# Patient Record
Sex: Female | Born: 1935 | Race: White | Hispanic: No | Marital: Married
Health system: Southern US, Community
[De-identification: ages and names within clinical notes are randomized; demographics above are authoritative.]

## PROBLEM LIST (undated history)

## (undated) DIAGNOSIS — C569 Malignant neoplasm of unspecified ovary: Secondary | ICD-10-CM

## (undated) HISTORY — PX: BREAST EXCISIONAL BIOPSY: SUR124

---

## 1998-05-24 ENCOUNTER — Other Ambulatory Visit: Admission: RE | Admit: 1998-05-24 | Discharge: 1998-05-24 | Payer: Self-pay | Admitting: *Deleted

## 1999-05-28 ENCOUNTER — Other Ambulatory Visit: Admission: RE | Admit: 1999-05-28 | Discharge: 1999-05-28 | Payer: Self-pay | Admitting: *Deleted

## 2000-04-21 ENCOUNTER — Encounter: Admission: RE | Admit: 2000-04-21 | Discharge: 2000-04-21 | Payer: Self-pay | Admitting: *Deleted

## 2000-04-21 ENCOUNTER — Encounter: Payer: Self-pay | Admitting: *Deleted

## 2001-05-03 ENCOUNTER — Encounter: Admission: RE | Admit: 2001-05-03 | Discharge: 2001-05-03 | Payer: Self-pay | Admitting: *Deleted

## 2002-05-12 ENCOUNTER — Encounter: Payer: Self-pay | Admitting: *Deleted

## 2002-05-12 ENCOUNTER — Encounter: Admission: RE | Admit: 2002-05-12 | Discharge: 2002-05-12 | Payer: Self-pay | Admitting: *Deleted

## 2002-05-18 ENCOUNTER — Encounter: Admission: RE | Admit: 2002-05-18 | Discharge: 2002-05-18 | Payer: Self-pay | Admitting: *Deleted

## 2002-05-18 ENCOUNTER — Encounter: Payer: Self-pay | Admitting: *Deleted

## 2003-05-23 ENCOUNTER — Encounter: Payer: Self-pay | Admitting: Family Medicine

## 2003-05-23 ENCOUNTER — Encounter: Admission: RE | Admit: 2003-05-23 | Discharge: 2003-05-23 | Payer: Self-pay | Admitting: Family Medicine

## 2004-06-06 ENCOUNTER — Encounter: Admission: RE | Admit: 2004-06-06 | Discharge: 2004-06-06 | Payer: Self-pay | Admitting: Family Medicine

## 2004-09-10 ENCOUNTER — Ambulatory Visit (HOSPITAL_COMMUNITY): Admission: RE | Admit: 2004-09-10 | Discharge: 2004-09-10 | Payer: Self-pay | Admitting: Gastroenterology

## 2004-09-10 ENCOUNTER — Encounter (INDEPENDENT_AMBULATORY_CARE_PROVIDER_SITE_OTHER): Payer: Self-pay | Admitting: *Deleted

## 2005-06-12 ENCOUNTER — Encounter: Admission: RE | Admit: 2005-06-12 | Discharge: 2005-06-12 | Payer: Self-pay | Admitting: Family Medicine

## 2006-06-15 ENCOUNTER — Encounter: Admission: RE | Admit: 2006-06-15 | Discharge: 2006-06-15 | Payer: Self-pay | Admitting: Family Medicine

## 2007-05-04 ENCOUNTER — Encounter: Admission: RE | Admit: 2007-05-04 | Discharge: 2007-05-04 | Payer: Self-pay | Admitting: Gastroenterology

## 2007-06-21 ENCOUNTER — Encounter: Admission: RE | Admit: 2007-06-21 | Discharge: 2007-06-21 | Payer: Self-pay | Admitting: Family Medicine

## 2007-07-05 ENCOUNTER — Ambulatory Visit (HOSPITAL_COMMUNITY): Admission: RE | Admit: 2007-07-05 | Discharge: 2007-07-05 | Payer: Self-pay | Admitting: Gastroenterology

## 2007-11-26 ENCOUNTER — Ambulatory Visit (HOSPITAL_COMMUNITY): Admission: RE | Admit: 2007-11-26 | Discharge: 2007-11-26 | Payer: Self-pay | Admitting: Gastroenterology

## 2008-06-21 ENCOUNTER — Encounter: Admission: RE | Admit: 2008-06-21 | Discharge: 2008-06-21 | Payer: Self-pay | Admitting: Family Medicine

## 2009-06-22 ENCOUNTER — Encounter: Admission: RE | Admit: 2009-06-22 | Discharge: 2009-06-22 | Payer: Self-pay | Admitting: Family Medicine

## 2010-06-25 ENCOUNTER — Encounter: Admission: RE | Admit: 2010-06-25 | Discharge: 2010-06-25 | Payer: Self-pay | Admitting: Family Medicine

## 2011-05-06 NOTE — Op Note (Signed)
[name], Leslie Keith              ACCOUNT NO.:  [id number]   MEDICAL RECORD NO.:  [id number]          PATIENT TYPE:  AMB   LOCATION:  ENDO                         FACILITY:  [facility]   PHYSICIAN:  Graylin Shiver, M.D.   DATE OF BIRTH:  Feb 10, 1931   DATE OF PROCEDURE:  11/26/2007  DATE OF DISCHARGE:  11/26/2007                               OPERATIVE REPORT   PROCEDURE:  Esophagogastroduodenoscopy with endoscopic balloon  dilatation of a Schatzki's ring.   INDICATIONS FOR PROCEDURE:  Dysphagia, history of a Schatzki's ring  previously dilated to 18 mm.  The patient has had some recurrent  symptoms of dysphagia.  Informed consent was obtained after explanation  of the risks of bleeding, infection, and perforation.   PREMEDICATION:  1. Fentanyl 70 mcg IV.  2. Versed 7 mg IV.   DESCRIPTION OF PROCEDURE:  With the patient in the left lateral  decubitus position, the Pentax gastroscope was inserted into the  oropharynx and passed into the esophagus.  It was advanced down the  esophagus, then into the stomach, and into the duodenum.  The second  portion and bulb of the duodenum were normal.  The stomach showed normal  mucosa.  There was a hiatal hernia.  A Schatzki's ring was identified in  the distal esophagus.  An endoscopic balloon dilator was advanced down  the scope and appropriately placed to the level of the ring.  The  balloon was inflated to 18 mm and held in place for one minute and then  19 mm and held in place for one minute.  There was some heme around the  dilator site.  The rest of the esophagus looked normal.  She tolerated  the procedure well without complications.   IMPRESSION:  1. Schatzki's ring dilated to 19 mm.  2. Hiatal hernia.           ______________________________  Graylin Shiver, M.D.     SFG/MEDQ  D:  11/28/2007  T:  11/28/2007  Job:  [id number]

## 2011-05-06 NOTE — Op Note (Signed)
Leslie Keith, Leslie Keith              ACCOUNT NO.:  [id number]   MEDICAL RECORD NO.:  [id number]          PATIENT TYPE:  AMB   LOCATION:  ENDO                         FACILITY:  [facility]   PHYSICIAN:  Graylin Shiver, M.D.   DATE OF BIRTH:  Nov 19, 1931   DATE OF PROCEDURE:  07/05/2007  DATE OF DISCHARGE:                               OPERATIVE REPORT   PROCEDURE:  Esophagogastroduodenoscopy with endoscopic balloon  dilatation of a Schatzki's ring.   INDICATIONS FOR PROCEDURE:  Dysphasia.  A barium swallow was done.  There was a small hiatal hernia and review of this did reveal a  Schatzki's ring.   Informed consent was obtained after explanation of the risks of  bleeding, infection and perforation.   PREMEDICATION:  Fentanyl 100 mcg IV, Versed 8 mg IV.   PROCEDURE:  With the patient in the left lateral decubitus position, the  Pentax gastroscope was inserted into the oropharynx and passed into the  esophagus.  It was advanced down the esophagus then into the stomach and  into the duodenum.  The second portion and bulb of the duodenum looked  normal.  The stomach had normal-appearing mucosa.  There was a moderate-  sized hiatal hernia.  At the level of the distal esophagus there was a  Schatzki's ring which appeared to be causing some mild narrowing of the  esophageal lumen.  An endoscopic balloon dilator was advanced down the  scope and appropriately placed at the level of the Schatzki's ring.  It  was inflated to 15, then 16.5, then 18 mm and held in place at each  level for one minute.  It was then deflated and removed.  There was some  heme at the site of the dilation.  The rest of the esophagus looked  normal.  She tolerated the procedure well without complications.   IMPRESSION:  1. Schatzki's ring.  2. Hiatal hernia.   PLAN:  We will observe the response to the dilatation.           ______________________________  Graylin Shiver, M.D.     SFG/MEDQ  D:  07/05/2007  T:   07/05/2007  Job:  [id number]   cc:   Joycelyn Rua, M.D.  Fax: [phone]

## 2011-05-09 NOTE — Op Note (Signed)
NAMEMAYBELLE, DEPAOLI NO.:  [id number]   MEDICAL RECORD NO.:  [id number]          PATIENT TYPE:  AMB   LOCATION:  ENDO                         FACILITY:  [facility]   PHYSICIAN:  Graylin Shiver, M.D.   DATE OF BIRTH:  March 09, 1931   DATE OF PROCEDURE:  09/10/2004  DATE OF DISCHARGE:                                 OPERATIVE REPORT   INDICATIONS FOR PROCEDURE:  Screening.  Informed consent was obtained after  explanation of the risks of bleeding, infection, and perforation.   PREMEDICATION:  Fentanyl 75 mcg IV, Versed 7.5 mg IV.   DESCRIPTION OF PROCEDURE:  With the patient in the left lateral decubitus  position, a rectal examination was performed.  No masses were felt.  The  Olympus colonoscope was inserted into the rectum and advanced around the  colon to the cecum.  Cecal landmarks were identified.  The cecum looked  normal.  In the ascending colon, there was a small 3 mm polyp biopsied off  with cold forceps.  The transverse colon looked normal, the descending  colon, sigmoid, and rectum looked normal.  She tolerated the procedure well  without complications.   IMPRESSION:  Small ascending colon polyp.   PLAN:  The biopsies will be checked.   Of note is that the patient was given preprocedure antibiotics because of a  history of rheumatic fever in the past.       SFG/MEDQ  D:  09/10/2004  T:  09/11/2004  Job:  [id number]   cc:   Joycelyn Rua, M.D.  [address] [place]  [place] [address]  Fax: [phone]

## 2011-06-05 ENCOUNTER — Other Ambulatory Visit: Payer: Self-pay | Admitting: Family Medicine

## 2011-06-05 DIAGNOSIS — Z1231 Encounter for screening mammogram for malignant neoplasm of breast: Secondary | ICD-10-CM

## 2011-06-27 ENCOUNTER — Ambulatory Visit
Admission: RE | Admit: 2011-06-27 | Discharge: 2011-06-27 | Disposition: A | Discharge status: Home / Self Care | Payer: 59 | Source: Ambulatory Visit | Attending: Family Medicine | Admitting: Family Medicine

## 2011-06-27 DIAGNOSIS — Z1231 Encounter for screening mammogram for malignant neoplasm of breast: Secondary | ICD-10-CM

## 2011-09-03 ENCOUNTER — Ambulatory Visit (HOSPITAL_COMMUNITY)
Admission: RE | Admit: 2011-09-03 | Discharge: 2011-09-03 | Disposition: A | Discharge status: Home / Self Care | Payer: Medicare Other | Source: Ambulatory Visit | Attending: Gastroenterology | Admitting: Gastroenterology

## 2011-09-03 DIAGNOSIS — K222 Esophageal obstruction: Secondary | ICD-10-CM | POA: Insufficient documentation

## 2011-09-03 DIAGNOSIS — Z79899 Other long term (current) drug therapy: Secondary | ICD-10-CM | POA: Insufficient documentation

## 2011-09-03 DIAGNOSIS — E785 Hyperlipidemia, unspecified: Secondary | ICD-10-CM | POA: Insufficient documentation

## 2011-09-03 DIAGNOSIS — R1311 Dysphagia, oral phase: Secondary | ICD-10-CM | POA: Insufficient documentation

## 2011-09-03 DIAGNOSIS — K648 Other hemorrhoids: Secondary | ICD-10-CM | POA: Insufficient documentation

## 2011-09-03 DIAGNOSIS — I10 Essential (primary) hypertension: Secondary | ICD-10-CM | POA: Insufficient documentation

## 2011-09-03 DIAGNOSIS — R195 Other fecal abnormalities: Secondary | ICD-10-CM | POA: Insufficient documentation

## 2011-09-03 DIAGNOSIS — K449 Diaphragmatic hernia without obstruction or gangrene: Secondary | ICD-10-CM | POA: Insufficient documentation

## 2011-09-05 NOTE — Op Note (Signed)
  NAMEDAISEY, CALOCA NO.:  [id number]  MEDICAL RECORD NO.:  [id number]  LOCATION:  WLEN                         FACILITY:  [facility]  PHYSICIAN:  Graylin Shiver, M.D.   DATE OF BIRTH:  [birth year]  DATE OF PROCEDURE:  09/03/2011 DATE OF DISCHARGE:                              OPERATIVE REPORT   PROCEDURE:  Colonoscopy.  INDICATIONS FOR PROCEDURE:  Heme-positive stool, rule out colon lesion.  Informed consent was obtained after explanation of the risks of bleeding, infection, and perforation.  PREMEDICATION:  The procedure was done after an EGD.  See EGD report for total dosage given.  DESCRIPTION OF THE PROCEDURE:  With the patient in the left lateral decubitus position, a rectal exam was performed and no masses were felt. The Pentax colonoscope was inserted into the rectum and advanced around the colon to the cecum.  Cecal landmarks were identified.  The cecum and ascending colon were normal.  The transverse colon normal.  The descending colon, sigmoid, and rectum were normal.  The scope was retroflexed and there were some small internal hemorrhoids.  She tolerated the procedure well without complications.  IMPRESSION:  Normal colonoscopy to the cecum with small internal hemorrhoids.          ______________________________ Graylin Shiver, M.D.     SFG/MEDQ  D:  09/03/2011  T:  09/03/2011  Job:  [id number]  Electronically Signed by Herbert Moors MD on 09/05/2011 05:00:58 PM

## 2011-09-05 NOTE — Op Note (Signed)
  NAMEZAIAH, CREDEUR NO.:  [id number]  MEDICAL RECORD NO.:  [id number]  LOCATION:  WLEN                         FACILITY:  [facility]  PHYSICIAN:  Graylin Shiver, M.D.   DATE OF BIRTH:  1931/08/23  DATE OF PROCEDURE: DATE OF DISCHARGE:                              OPERATIVE REPORT   PROCEDURE:  Esophagogastroduodenoscopy with balloon dilatation of a Schatzki ring.  INDICATION FOR PROCEDURE:  Dysphagia secondary to a Schatzki ring.  Informed consent was obtained after explanation of the risks of bleeding, infection, and perforation.  PREMEDICATIONS: 1. Fentanyl 75 mcg IV. 2. Versed 6 mg IV.  DESCRIPTION OF THE PROCEDURE:  With the patient in the left lateral decubitus position, the Pentax gastroscope was inserted into the oropharynx and passed into the esophagus.  It was advanced down the esophagus to the level of the Schatzki ring.  The scope was able to be advanced into the stomach and then into the duodenum.  The second portion and bulb of the duodenum were normal.  The stomach had a hiatal hernia present, but the mucosa otherwise looked unremarkable.  The scope was brought back up into the esophagus, then a balloon dilator was advanced down the scope and placed at the level of the Schatzki ring. It was inflated to 18 mm.  There was heme at the site of the dilation. She tolerated the procedure well.  No other abnormalities were seen.  IMPRESSION:  Schatzki ring dilated to 18 mm.          ______________________________ Graylin Shiver, M.D.     SFG/MEDQ  D:  09/03/2011  T:  09/03/2011  Job:  [id number]  Electronically Signed by Herbert Moors MD on 09/05/2011 05:00:56 PM

## 2012-06-02 ENCOUNTER — Other Ambulatory Visit: Payer: Self-pay | Admitting: Family Medicine

## 2012-06-02 DIAGNOSIS — Z1231 Encounter for screening mammogram for malignant neoplasm of breast: Secondary | ICD-10-CM

## 2012-06-28 ENCOUNTER — Ambulatory Visit
Admission: RE | Admit: 2012-06-28 | Discharge: 2012-06-28 | Disposition: A | Discharge status: Home / Self Care | Payer: Medicare Other | Source: Ambulatory Visit | Attending: Family Medicine | Admitting: Family Medicine

## 2012-06-28 DIAGNOSIS — Z1231 Encounter for screening mammogram for malignant neoplasm of breast: Secondary | ICD-10-CM

## 2013-05-23 ENCOUNTER — Other Ambulatory Visit: Payer: Self-pay

## 2013-05-23 DIAGNOSIS — Z1231 Encounter for screening mammogram for malignant neoplasm of breast: Secondary | ICD-10-CM

## 2013-06-30 ENCOUNTER — Ambulatory Visit
Admission: RE | Admit: 2013-06-30 | Discharge: 2013-06-30 | Disposition: A | Discharge status: Home / Self Care | Payer: Medicare Other | Source: Ambulatory Visit

## 2013-06-30 DIAGNOSIS — Z1231 Encounter for screening mammogram for malignant neoplasm of breast: Secondary | ICD-10-CM

## 2013-08-03 ENCOUNTER — Other Ambulatory Visit: Payer: Self-pay

## 2014-05-23 ENCOUNTER — Other Ambulatory Visit: Payer: Self-pay

## 2014-05-23 DIAGNOSIS — Z1231 Encounter for screening mammogram for malignant neoplasm of breast: Secondary | ICD-10-CM

## 2014-06-27 ENCOUNTER — Other Ambulatory Visit (HOSPITAL_BASED_OUTPATIENT_CLINIC_OR_DEPARTMENT_OTHER): Payer: Self-pay | Admitting: Family Medicine

## 2014-06-27 ENCOUNTER — Encounter (HOSPITAL_BASED_OUTPATIENT_CLINIC_OR_DEPARTMENT_OTHER): Payer: Self-pay

## 2014-06-27 ENCOUNTER — Ambulatory Visit (HOSPITAL_BASED_OUTPATIENT_CLINIC_OR_DEPARTMENT_OTHER)
Admission: RE | Admit: 2014-06-27 | Discharge: 2014-06-27 | Disposition: A | Discharge status: Home / Self Care | Payer: Medicare Other | Source: Ambulatory Visit | Attending: Family Medicine | Admitting: Family Medicine

## 2014-06-27 DIAGNOSIS — R1900 Intra-abdominal and pelvic swelling, mass and lump, unspecified site: Secondary | ICD-10-CM

## 2014-06-27 MED ORDER — IOHEXOL 300 MG/ML  SOLN
100.0000 mL | Freq: Once | INTRAMUSCULAR | Status: AC | PRN
  Administered 2014-06-27: 100 mL via INTRAVENOUS

## 2014-07-03 ENCOUNTER — Ambulatory Visit
Admission: RE | Admit: 2014-07-03 | Discharge: 2014-07-03 | Disposition: A | Discharge status: Home / Self Care | Payer: 59 | Source: Ambulatory Visit

## 2014-07-03 DIAGNOSIS — Z1231 Encounter for screening mammogram for malignant neoplasm of breast: Secondary | ICD-10-CM

## 2015-05-29 ENCOUNTER — Other Ambulatory Visit: Payer: Self-pay

## 2015-05-29 DIAGNOSIS — Z1231 Encounter for screening mammogram for malignant neoplasm of breast: Secondary | ICD-10-CM

## 2015-07-06 ENCOUNTER — Ambulatory Visit: Payer: Self-pay

## 2015-07-24 ENCOUNTER — Ambulatory Visit
Admission: RE | Admit: 2015-07-24 | Discharge: 2015-07-24 | Disposition: A | Discharge status: Home / Self Care | Payer: Medicare Other | Source: Ambulatory Visit

## 2015-07-24 DIAGNOSIS — Z1231 Encounter for screening mammogram for malignant neoplasm of breast: Secondary | ICD-10-CM

## 2016-07-03 ENCOUNTER — Other Ambulatory Visit: Payer: Self-pay | Admitting: Family Medicine

## 2016-07-03 DIAGNOSIS — Z1231 Encounter for screening mammogram for malignant neoplasm of breast: Secondary | ICD-10-CM

## 2016-07-24 ENCOUNTER — Ambulatory Visit
Admission: RE | Admit: 2016-07-24 | Discharge: 2016-07-24 | Disposition: A | Discharge status: Home / Self Care | Payer: Medicare Other | Source: Ambulatory Visit | Attending: Family Medicine | Admitting: Family Medicine

## 2016-07-24 DIAGNOSIS — Z1231 Encounter for screening mammogram for malignant neoplasm of breast: Secondary | ICD-10-CM

## 2017-06-17 ENCOUNTER — Other Ambulatory Visit: Payer: Self-pay | Admitting: Family Medicine

## 2017-06-17 DIAGNOSIS — Z1231 Encounter for screening mammogram for malignant neoplasm of breast: Secondary | ICD-10-CM

## 2017-07-27 ENCOUNTER — Ambulatory Visit
Admission: RE | Admit: 2017-07-27 | Discharge: 2017-07-27 | Disposition: A | Discharge status: Home / Self Care | Payer: Medicare Other | Source: Ambulatory Visit | Attending: Family Medicine | Admitting: Family Medicine

## 2017-07-27 DIAGNOSIS — Z1231 Encounter for screening mammogram for malignant neoplasm of breast: Secondary | ICD-10-CM

## 2017-07-27 HISTORY — DX: Malignant neoplasm of unspecified ovary: C56.9

## 2018-06-18 ENCOUNTER — Emergency Department (HOSPITAL_COMMUNITY)
Admission: EM | Admit: 2018-06-18 | Discharge: 2018-06-18 | Disposition: A | Discharge status: Home / Self Care | Payer: Medicare Other | Attending: Emergency Medicine | Admitting: Emergency Medicine

## 2018-06-18 ENCOUNTER — Encounter (HOSPITAL_COMMUNITY): Payer: Self-pay

## 2018-06-18 ENCOUNTER — Emergency Department (HOSPITAL_COMMUNITY): Payer: Medicare Other

## 2018-06-18 ENCOUNTER — Other Ambulatory Visit: Payer: Self-pay

## 2018-06-18 DIAGNOSIS — R509 Fever, unspecified: Secondary | ICD-10-CM

## 2018-06-18 DIAGNOSIS — R5381 Other malaise: Secondary | ICD-10-CM | POA: Insufficient documentation

## 2018-06-18 DIAGNOSIS — Z79899 Other long term (current) drug therapy: Secondary | ICD-10-CM | POA: Insufficient documentation

## 2018-06-18 DIAGNOSIS — Z8543 Personal history of malignant neoplasm of ovary: Secondary | ICD-10-CM | POA: Insufficient documentation

## 2018-06-18 DIAGNOSIS — Z7982 Long term (current) use of aspirin: Secondary | ICD-10-CM | POA: Diagnosis not present

## 2018-06-18 LAB — COMPREHENSIVE METABOLIC PANEL
ALBUMIN: 3 g/dL — AB (ref 3.5–5.0)
ALK PHOS: 55 U/L (ref 38–126)
ALT: 37 U/L (ref 0–44)
AST: 33 U/L (ref 15–41)
Anion gap: 9 (ref 5–15)
BILIRUBIN TOTAL: 0.4 mg/dL (ref 0.3–1.2)
BUN: 19 mg/dL (ref 8–23)
CO2: 25 mmol/L (ref 22–32)
CREATININE: 0.71 mg/dL (ref 0.44–1.00)
Calcium: 8.4 mg/dL — ABNORMAL LOW (ref 8.9–10.3)
Chloride: 99 mmol/L (ref 98–111)
GFR calc Af Amer: >60 mL/min (ref >60)
Glucose, Bld: 161 mg/dL — ABNORMAL HIGH (ref 70–99)
Potassium: 3.3 mmol/L — ABNORMAL LOW (ref 3.5–5.1)
Sodium: 133 mmol/L — ABNORMAL LOW (ref 135–145)
TOTAL PROTEIN: 6 g/dL — AB (ref 6.5–8.1)

## 2018-06-18 LAB — URINALYSIS, ROUTINE W REFLEX MICROSCOPIC
Bilirubin Urine: NEGATIVE
GLUCOSE, UA: NEGATIVE mg/dL
HGB URINE DIPSTICK: NEGATIVE
KETONES UR: 20 mg/dL — AB
LEUKOCYTES UA: NEGATIVE
Nitrite: NEGATIVE
PROTEIN: NEGATIVE mg/dL
Specific Gravity, Urine: 1.018 (ref 1.005–1.030)
pH: 7 (ref 5.0–8.0)

## 2018-06-18 LAB — CBC WITH DIFFERENTIAL/PLATELET
BASOS ABS: 0 10*3/uL (ref 0.0–0.1)
Basophils Relative: 0 %
EOS PCT: 0 %
Eosinophils Absolute: 0 10*3/uL (ref 0.0–0.7)
HCT: 35.4 % — ABNORMAL LOW (ref 36.0–46.0)
Hemoglobin: 11 g/dL — ABNORMAL LOW (ref 12.0–15.0)
LYMPHS PCT: 12 %
Lymphs Abs: 0.7 10*3/uL (ref 0.7–4.0)
MCH: 26.2 pg (ref 26.0–34.0)
MCHC: 31.1 g/dL (ref 30.0–36.0)
MCV: 84.3 fL (ref 78.0–100.0)
MONO ABS: 0.3 10*3/uL (ref 0.1–1.0)
Monocytes Relative: 6 %
Neutro Abs: 4.8 10*3/uL (ref 1.7–7.7)
Neutrophils Relative %: 82 %
PLATELETS: 174 10*3/uL (ref 150–400)
RBC: 4.2 MIL/uL (ref 3.87–5.11)
RDW: 13.5 % (ref 11.5–15.5)
WBC: 5.9 10*3/uL (ref 4.0–10.5)

## 2018-06-18 LAB — I-STAT CG4 LACTIC ACID, ED
Lactic Acid, Venous: 1.98 mmol/L — ABNORMAL HIGH (ref 0.5–1.9)
Lactic Acid, Venous: 1.31 mmol/L (ref 0.5–1.9)

## 2018-06-18 MED ORDER — IOPAMIDOL (ISOVUE-300) INJECTION 61%
100.0000 mL | Freq: Once | INTRAVENOUS | Status: AC | PRN
  Administered 2018-06-18: 100 mL via INTRAVENOUS

## 2018-06-18 MED ORDER — SODIUM CHLORIDE 0.9 % IV BOLUS
500.0000 mL | Freq: Once | INTRAVENOUS | Status: AC
  Administered 2018-06-18: 500 mL via INTRAVENOUS

## 2018-06-18 MED ORDER — ACETAMINOPHEN 325 MG PO TABS: 650.0000 mg | ORAL_TABLET | Freq: Once | ORAL | Status: DC

## 2018-06-18 MED ORDER — IOPAMIDOL (ISOVUE-300) INJECTION 61%
INTRAVENOUS | Status: AC
Start: 2018-06-18 — End: 2018-06-18
  Administered 2018-06-18: 19:00:00
  Filled 2018-06-18: qty 100

## 2018-06-18 MED ORDER — DOXYCYCLINE HYCLATE 100 MG PO CAPS: 100.0000 mg | ORAL_CAPSULE | Freq: Two times a day (BID) | ORAL | 0 refills | Status: AC

## 2018-06-18 MED ORDER — SODIUM CHLORIDE 0.9 % IV SOLN
INTRAVENOUS | Status: DC
  Administered 2018-06-18: 15:00:00 via INTRAVENOUS

## 2018-06-18 MED ORDER — IOPAMIDOL (ISOVUE-300) INJECTION 61%
30.0000 mL | Freq: Once | INTRAVENOUS | Status: AC
  Administered 2018-06-18: 30 mL via ORAL

## 2018-06-18 NOTE — ED Triage Notes (Addendum)
Pt arrives from PCP via EMS--Pt c/o nausea, malaise, weakness x2 days. Was seen at PCP and encouraged pt to be seen in ED as she was too weak to stand up. Fever 102 F with EMS, 1000mg  tylenol given PO en route.Hx of ovarian CA, Pt states she feels very similar to when she was first dx, she reports being in remission but that she has a very aggressive type of CA. A/Ox4.

## 2018-06-18 NOTE — ED Notes (Signed)
Bed: WA23 Expected date:  Expected time:  Means of arrival:  Comments: EMS 

## 2018-06-18 NOTE — ED Provider Notes (Signed)
[facility] DEPT Provider Note   CSN: [id number] Arrival date & time: 06/18/18  1203     History   Chief Complaint Chief Complaint  Patient presents with  . Emesis  . Fever  . Weakness    HPI Leslie Keith is a 84 y.o. female.  HPI Pt was outside in the yard yesterday afternoon when she suddenly felt chilled and then started to feel hot.  She has had general malaise.  No vomiting or diarrhea. No cough or dysuria.  No tick bites or rashes. She went to her doctors office today and they noticed she had a fever.  She was sent to the ED for further evaluation.  Pt is worried about cancer because she had a fever when she was diagnosed with ovarian cancer.  Past Medical History:  Diagnosis Date  . Ovarian cancer ([place])     There are no active problems to display for this patient.   Past Surgical History:  Procedure Laterality Date  . BREAST EXCISIONAL BIOPSY Right    benign     OB History   None      Home Medications    Prior to Admission medications   Medication Sig Start Date End Date Taking? Authorizing Provider  aspirin 81 MG tablet Take 81 mg by mouth daily.   Yes [provider]  Calcium Carb-Cholecalciferol 600-400 MG-UNIT TABS Take 1 tablet by mouth 2 (two) times daily.   Yes [provider]  diclofenac (VOLTAREN) 50 MG EC tablet Take 50 mg by mouth 3 (three) times daily as needed for pain. 04/12/18  Yes [provider]  hydrochlorothiazide (HYDRODIURIL) 12.5 MG tablet Take 12.5 mg by mouth daily. 06/11/18  Yes [provider]  lidocaine-prilocaine (EMLA) cream Apply 0.5 inches topically See admin instructions. 30 minutes prior to chemotherapy. Cover with syran wrap 02/19/17  Yes [provider]  Multiple Vitamins-Minerals (MULTIVITAMIN ADULT PO) Take 1 tablet by mouth daily.   Yes [provider]  Omega-3 Krill Oil 300 MG CAPS Take 300 mg by mouth daily.   Yes [provider]  ramipril (ALTACE) 10 MG capsule Take 10 mg by mouth daily. 05/12/18  Yes [provider]  TOPROL XL 100 MG 24 hr tablet Take 100 mg by mouth daily. 05/12/18  Yes [provider]    Family History History reviewed. No pertinent family history.  Social History Social History   Tobacco Use  . Smoking status: Unknown If Ever Smoked  Substance Use Topics  . Alcohol use: Not on file  . Drug use: Not on file     Allergies   Ciprofloxacin and Fluocinolone   Review of Systems Review of Systems  All other systems reviewed and are negative.    Physical Exam Updated Vital Signs BP 131/66   Pulse 82   Temp 98.5 F (36.9 C) (Oral)   Resp 20   Ht 1.524 m (5')   Wt 49.9 kg (110 lb)   SpO2 97%   BMI 21.48 kg/m   Physical Exam  HENT:  Head: Normocephalic and atraumatic.  Right Ear: External ear normal.  Left Ear: External ear normal.  Eyes: Conjunctivae are normal. Right eye exhibits no discharge. Left eye exhibits no discharge. No scleral icterus.  Neck: Neck supple. No tracheal deviation present.  Cardiovascular: Normal rate, regular rhythm and intact distal pulses.  Pulmonary/Chest: Effort normal and breath sounds normal. No stridor. No respiratory distress. She has no wheezes. She has no  rales.  Abdominal: Soft. Bowel sounds are normal. She exhibits no distension. There is no tenderness. There is no rebound and no guarding.  Musculoskeletal: She exhibits no edema or tenderness.  Neurological: She is alert. She has normal strength. No cranial nerve deficit (no facial droop, extraocular movements intact, no slurred speech) or sensory deficit. She exhibits normal muscle tone. She displays no seizure activity. Coordination normal.  Skin: Skin is warm and dry. No rash noted. She is not diaphoretic.  Psychiatric: She has a normal mood and affect.  Nursing note and vitals reviewed.    ED Treatments / Results  Labs (all labs ordered are listed, but only  abnormal results are displayed) Labs Reviewed  COMPREHENSIVE METABOLIC PANEL - Abnormal; Notable for the following components:      Result Value   Sodium 133 (*)    Potassium 3.3 (*)    Glucose, Bld 161 (*)    Calcium 8.4 (*)    Total Protein 6.0 (*)    Albumin 3.0 (*)    All other components within normal limits  CBC WITH DIFFERENTIAL/PLATELET - Abnormal; Notable for the following components:   Hemoglobin 11.0 (*)    HCT 35.4 (*)    All other components within normal limits  URINALYSIS, ROUTINE W REFLEX MICROSCOPIC - Abnormal; Notable for the following components:   APPearance HAZY (*)    Ketones, ur 20 (*)    All other components within normal limits  I-STAT CG4 LACTIC ACID, ED - Abnormal; Notable for the following components:   Lactic Acid, Venous 1.98 (*)    All other components within normal limits  CULTURE, BLOOD (ROUTINE X 2)  CULTURE, BLOOD (ROUTINE X 2)  URINE CULTURE  ROCKY MTN SPOTTED FVR ABS PNL(IGG+IGM)  I-STAT CG4 LACTIC ACID, ED    EKG None  Radiology Dg Chest 2 View  Result Date: 06/18/2018 CLINICAL DATA:  84 year old female with weakness and subjective fever EXAM: CHEST - 2 VIEW COMPARISON:  None. FINDINGS: Right IJ approach single-lumen power injectable port catheter. Catheter tip overlies the distal SVC. The cardiac and mediastinal contours are within normal limits. No focal airspace consolidation, pleural effusion, pulmonary edema or pneumothorax. Minimal bronchitic changes are very likely chronic. Degenerative facet arthropathy present in the visualized cervical spine. No acute osseous abnormality. IMPRESSION: 1. No acute cardiopulmonary process. 2. Right IJ approach single-lumen power injectable port catheter with the catheter tip overlying the distal SVC. Electronically Signed   By: Jacqulynn Cadet M.D.   On: 06/18/2018 13:20    Procedures Procedures (including critical care time)  Medications Ordered in ED Medications  acetaminophen (TYLENOL)  tablet 650 mg (has no administration in time range)  0.9 %  sodium chloride infusion ( Intravenous New Bag/Given 06/18/18 1438)  sodium chloride 0.9 % bolus 500 mL (has no administration in time range)  iopamidol (ISOVUE-300) 61 % injection 30 mL (30 mLs Oral Contrast Given 06/18/18 1539)     Initial Impression / Assessment and Plan / ED Course  I have reviewed the triage vital signs and the nursing notes.  Pertinent labs & imaging results that were available during my care of the patient were reviewed by me and considered in my medical decision making (see chart for details).   Patient's laboratory tests and x-ray was reviewed.  No clear etiology for her fever.  Patient does have a small rash on her upper back.  She has been out gardening.  She denies any tick bites but I will add on titers  [facility] spotted fever.  Patient is concerned about the recurrence of ovarian cancer since she initially presented with fevers.  We discussed treating empirically initially with outpatient follow-up.  Patient is very anxious and since we do not have an etiology for fever we will get a CT scan to evaluate for any acute pathology.  If negative I think the patient otherwise appears stable for outpatient management.  Empiric treatment with doxycycline considering her rash is reasonable.  Dr Tamera Punt will follow up on the labs  Final Clinical Impressions(s) / ED Diagnoses  pending   Dorie Rank, MD 06/18/18 [phone]

## 2018-06-18 NOTE — ED Provider Notes (Signed)
Patient care was taken over from Dr. Tomi Bamberger.  Patient presented with a fever.  She does not have an identifiable source.  Her labs are non-concerning.  She has no suggestions of sepsis.  CT scan shows no acute abnormality.  There is cholelithiasis without evidence of cholecystitis.  She does have a red circular area in the posterior aspect of her left shoulder.  It looks like it could be an insect bite versus tick bite.  It is not a target lesion.  There is no other rash.  Titers were spent for [facility] spotted fever.  I will start her on doxycycline.  I advised her to have close follow-up with her PCP and return here if she has any worsening symptoms over the weekend.  Results for orders placed or performed during the hospital encounter of 06/18/18  Comprehensive metabolic panel  Result Value Ref Range   Sodium 133 (L) 135 - 145 mmol/L   Potassium 3.3 (L) 3.5 - 5.1 mmol/L   Chloride 99 98 - 111 mmol/L   CO2 25 22 - 32 mmol/L   Glucose, Bld 161 (H) 70 - 99 mg/dL   BUN 19 8 - 23 mg/dL   Creatinine, Ser 0.71 0.44 - 1.00 mg/dL   Calcium 8.4 (L) 8.9 - 10.3 mg/dL   Total Protein 6.0 (L) 6.5 - 8.1 g/dL   Albumin 3.0 (L) 3.5 - 5.0 g/dL   AST 33 15 - 41 U/L   ALT 37 0 - 44 U/L   Alkaline Phosphatase 55 38 - 126 U/L   Total Bilirubin 0.4 0.3 - 1.2 mg/dL   GFR calc non Af Amer >60 >60 mL/min   GFR calc Af Amer >60 >60 mL/min   Anion gap 9 5 - 15  CBC WITH DIFFERENTIAL  Result Value Ref Range   WBC 5.9 4.0 - 10.5 K/uL   RBC 4.20 3.87 - 5.11 MIL/uL   Hemoglobin 11.0 (L) 12.0 - 15.0 g/dL   HCT 35.4 (L) 36.0 - 46.0 %   MCV 84.3 78.0 - 100.0 fL   MCH 26.2 26.0 - 34.0 pg   MCHC 31.1 30.0 - 36.0 g/dL   RDW 13.5 11.5 - 15.5 %   Platelets 174 150 - 400 K/uL   Neutrophils Relative % 82 %   Neutro Abs 4.8 1.7 - 7.7 K/uL   Lymphocytes Relative 12 %   Lymphs Abs 0.7 0.7 - 4.0 K/uL   Monocytes Relative 6 %   Monocytes Absolute 0.3 0.1 - 1.0 K/uL   Eosinophils Relative 0 %   Eosinophils Absolute 0.0  0.0 - 0.7 K/uL   Basophils Relative 0 %   Basophils Absolute 0.0 0.0 - 0.1 K/uL  Urinalysis, Routine w reflex microscopic  Result Value Ref Range   Color, Urine YELLOW YELLOW   APPearance HAZY (A) CLEAR   Specific Gravity, Urine 1.018 1.005 - 1.030   pH 7.0 5.0 - 8.0   Glucose, UA NEGATIVE NEGATIVE mg/dL   Hgb urine dipstick NEGATIVE NEGATIVE   Bilirubin Urine NEGATIVE NEGATIVE   Ketones, ur 20 (A) NEGATIVE mg/dL   Protein, ur NEGATIVE NEGATIVE mg/dL   Nitrite NEGATIVE NEGATIVE   Leukocytes, UA NEGATIVE NEGATIVE  I-Stat CG4 Lactic Acid, ED  (not at  [facility])  Result Value Ref Range   Lactic Acid, Venous 1.98 (H) 0.5 - 1.9 mmol/L  I-Stat CG4 Lactic Acid, ED  (not at  [facility])  Result Value Ref Range   Lactic Acid, Venous 1.31 0.5 - 1.9 mmol/L  Dg Chest 2 View  Result Date: 06/18/2018 CLINICAL DATA:  84 year old female with weakness and subjective fever EXAM: CHEST - 2 VIEW COMPARISON:  None. FINDINGS: Right IJ approach single-lumen power injectable port catheter. Catheter tip overlies the distal SVC. The cardiac and mediastinal contours are within normal limits. No focal airspace consolidation, pleural effusion, pulmonary edema or pneumothorax. Minimal bronchitic changes are very likely chronic. Degenerative facet arthropathy present in the visualized cervical spine. No acute osseous abnormality. IMPRESSION: 1. No acute cardiopulmonary process. 2. Right IJ approach single-lumen power injectable port catheter with the catheter tip overlying the distal SVC. Electronically Signed   By: Jacqulynn Cadet M.D.   On: 06/18/2018 13:20   Ct Abdomen Pelvis W Contrast  Result Date: 06/18/2018 CLINICAL DATA:  Nausea, malaise and weakness x2 days. History of ovarian cancer. EXAM: CT ABDOMEN AND PELVIS WITH CONTRAST TECHNIQUE: Multidetector CT imaging of the abdomen and pelvis was performed using the standard protocol following bolus administration of intravenous contrast. CONTRAST:  150mL ISOVUE-300  IOPAMIDOL (ISOVUE-300) INJECTION 61%, 66mL ISOVUE-300 IOPAMIDOL (ISOVUE-300) INJECTION 61% COMPARISON:  None. FINDINGS: Lower chest: Minimal bibasilar atelectasis. Small hiatal hernia. Normal sized included heart without pericardial effusion. Hepatobiliary: Nonobstructing calculus near the fundus of the gallbladder measuring 3 mm. Homogeneous attenuation of the liver without space-occupying mass. No biliary dilatation is noted. Pancreas: No enhancing pancreatic mass. Pancreatic duct is visualized but is within normal limits in caliber measuring 3 mm. Mild inflammation. Spleen: Normal size spleen without focal mass. Adrenals/Urinary Tract: Normal bilateral adrenal glands. Symmetric cortical enhancement of both kidneys. Tiny too small to characterize hypodensities are noted within both kidneys statistically consistent with tiny cysts. No nephrolithiasis nor obstructive uropathy. The urinary bladder is nondistended and is without focal mural thickening or calculus. Stomach/Bowel: Small hiatal hernia stated. Nondistended stomach. Normal duodenal sweep and ligament of Treitz position. Contrast has entered large bowel without obstruction. No acute bowel obstruction is identified. No mural thickening to suggest inflammation. Moderate fecal retention is noted in the rectosigmoid. Vascular/Lymphatic: Small 5 mm short axis aortocaval lymph node in the upper abdomen. No retroperitoneal, mesenteric, pelvic sidewall or inguinal adenopathy. Nonaneurysmal thoracic aorta with minimal atherosclerosis proximally. Reproductive: Hysterectomy.  No adnexal mass. Other: No ascites or free air. Musculoskeletal: T12 through L2 and L3 through L5 marked degenerative disc disease. Associated lumbar facet arthropathy L1 through L5. No aggressive osseous lesions. IMPRESSION: 1. Small hiatal hernia. 2. Uncomplicated cholelithiasis. 3. Bilateral tiny too small to characterize hypodensities within both kidneys, statistically consistent with cysts.  No obstructive uropathy or nephrolithiasis. 4. Moderate stool retention in the rectosigmoid. No bowel obstruction or inflammation. 5. No ascites or adenopathy. No adnexal mass in this patient with history of ovarian cancer. 6. Thoracolumbar spondylosis. Electronically Signed   By: Ashley Royalty M.D.   On: 06/18/2018 18:09      Malvin Johns, MD 06/18/18 1843

## 2018-06-19 LAB — URINE CULTURE

## 2018-06-22 ENCOUNTER — Other Ambulatory Visit: Payer: Self-pay | Admitting: Family Medicine

## 2018-06-22 DIAGNOSIS — Z1231 Encounter for screening mammogram for malignant neoplasm of breast: Secondary | ICD-10-CM

## 2018-06-22 LAB — ROCKY MTN SPOTTED FVR ABS PNL(IGG+IGM)
RMSF IGG: NEGATIVE
RMSF IgM: 0.15 index (ref 0.00–0.89)

## 2018-06-23 LAB — CULTURE, BLOOD (ROUTINE X 2)
Culture: NO GROWTH
Culture: NO GROWTH
SPECIAL REQUESTS: ADEQUATE
SPECIAL REQUESTS: ADEQUATE

## 2018-07-29 ENCOUNTER — Ambulatory Visit
Admission: RE | Admit: 2018-07-29 | Discharge: 2018-07-29 | Disposition: A | Discharge status: Home / Self Care | Payer: Medicare Other | Source: Ambulatory Visit | Attending: Family Medicine | Admitting: Family Medicine

## 2018-07-29 DIAGNOSIS — Z1231 Encounter for screening mammogram for malignant neoplasm of breast: Secondary | ICD-10-CM

## 2019-06-21 ENCOUNTER — Other Ambulatory Visit: Payer: Self-pay | Admitting: Family Medicine

## 2019-06-21 DIAGNOSIS — Z1231 Encounter for screening mammogram for malignant neoplasm of breast: Secondary | ICD-10-CM

## 2019-08-05 ENCOUNTER — Ambulatory Visit
Admission: RE | Admit: 2019-08-05 | Discharge: 2019-08-05 | Disposition: A | Discharge status: Home / Self Care | Payer: Medicare Other | Source: Ambulatory Visit | Attending: Family Medicine | Admitting: Family Medicine

## 2019-08-05 ENCOUNTER — Other Ambulatory Visit: Payer: Self-pay

## 2019-08-05 DIAGNOSIS — Z1231 Encounter for screening mammogram for malignant neoplasm of breast: Secondary | ICD-10-CM

## 2020-06-29 ENCOUNTER — Other Ambulatory Visit: Payer: Self-pay | Admitting: Family Medicine

## 2020-06-29 DIAGNOSIS — Z1231 Encounter for screening mammogram for malignant neoplasm of breast: Secondary | ICD-10-CM

## 2020-08-06 ENCOUNTER — Ambulatory Visit
Admission: RE | Admit: 2020-08-06 | Discharge: 2020-08-06 | Disposition: A | Discharge status: Home / Self Care | Payer: Medicare Other | Source: Ambulatory Visit | Attending: Family Medicine | Admitting: Family Medicine

## 2020-08-06 ENCOUNTER — Other Ambulatory Visit: Payer: Self-pay

## 2020-08-06 DIAGNOSIS — Z1231 Encounter for screening mammogram for malignant neoplasm of breast: Secondary | ICD-10-CM

## 2020-11-26 ENCOUNTER — Other Ambulatory Visit (HOSPITAL_BASED_OUTPATIENT_CLINIC_OR_DEPARTMENT_OTHER): Payer: Self-pay | Admitting: Internal Medicine

## 2020-11-26 ENCOUNTER — Ambulatory Visit: Payer: Medicare Other | Attending: Internal Medicine

## 2020-11-26 DIAGNOSIS — Z23 Encounter for immunization: Secondary | ICD-10-CM

## 2020-11-26 NOTE — Progress Notes (Signed)
   Covid-19 Vaccination Clinic  Name:  Leslie Keith    MRN: [id number] DOB: [birth year]  11/26/2020  Leslie Keith was observed post Covid-19 immunization for 15 minutes without incident. She was provided with Vaccine Information Sheet and instruction to access the V-Safe system.   Leslie Keith was instructed to call 911 with any severe reactions post vaccine: Marland Kitchen Difficulty breathing  . Swelling of face and throat  . A fast heartbeat  . A bad rash all over body  . Dizziness and weakness   Immunizations Administered    Name Date Dose VIS Date Route   Pfizer COVID-19 Vaccine 11/26/2020 10:36 AM 0.3 mL 10/10/2020 Intramuscular   Manufacturer: [employer]   Lot: [id number]   [place]: [id number]

## 2020-12-03 MED FILL — PFIZER-BIONTECH COVID-19 VA: 30 | 1 days supply | Qty: 0 | Fill #0

## 2021-07-05 ENCOUNTER — Other Ambulatory Visit: Payer: Self-pay | Admitting: Family Medicine

## 2021-07-05 DIAGNOSIS — Z1231 Encounter for screening mammogram for malignant neoplasm of breast: Secondary | ICD-10-CM

## 2021-08-29 ENCOUNTER — Other Ambulatory Visit: Payer: Self-pay

## 2021-08-29 ENCOUNTER — Ambulatory Visit
Admission: RE | Admit: 2021-08-29 | Discharge: 2021-08-29 | Disposition: A | Discharge status: Home / Self Care | Payer: Medicare Other | Source: Ambulatory Visit | Attending: Family Medicine | Admitting: Family Medicine

## 2021-08-29 DIAGNOSIS — Z1231 Encounter for screening mammogram for malignant neoplasm of breast: Secondary | ICD-10-CM

## 2022-11-18 ENCOUNTER — Encounter: Payer: Self-pay | Admitting: Family Medicine

## 2022-11-18 DIAGNOSIS — Z1231 Encounter for screening mammogram for malignant neoplasm of breast: Secondary | ICD-10-CM

## 2022-11-27 ENCOUNTER — Other Ambulatory Visit: Payer: Self-pay | Admitting: Family Medicine

## 2022-11-27 DIAGNOSIS — Z1231 Encounter for screening mammogram for malignant neoplasm of breast: Secondary | ICD-10-CM

## 2023-01-22 ENCOUNTER — Ambulatory Visit
Admission: RE | Admit: 2023-01-22 | Discharge: 2023-01-22 | Disposition: A | Discharge status: Home / Self Care | Payer: Medicare Other | Source: Ambulatory Visit | Attending: Family Medicine | Admitting: Family Medicine

## 2023-01-22 DIAGNOSIS — Z1231 Encounter for screening mammogram for malignant neoplasm of breast: Secondary | ICD-10-CM

## 2023-05-05 IMAGING — MG MM DIGITAL SCREENING BILAT W/ TOMO AND CAD
8 series · 9 of 24 positions shown · non-contrast
Comparison: Previous exam(s).

CLINICAL DATA: Screening.

EXAM:
DIGITAL SCREENING BILATERAL MAMMOGRAM WITH TOMOSYNTHESIS AND CAD
TECHNIQUE: Bilateral screening digital craniocaudal and mediolateral oblique
mammograms were obtained. Bilateral screening digital breast
tomosynthesis was performed. The images were evaluated with
computer-aided detection.

[R CC synth-2D]
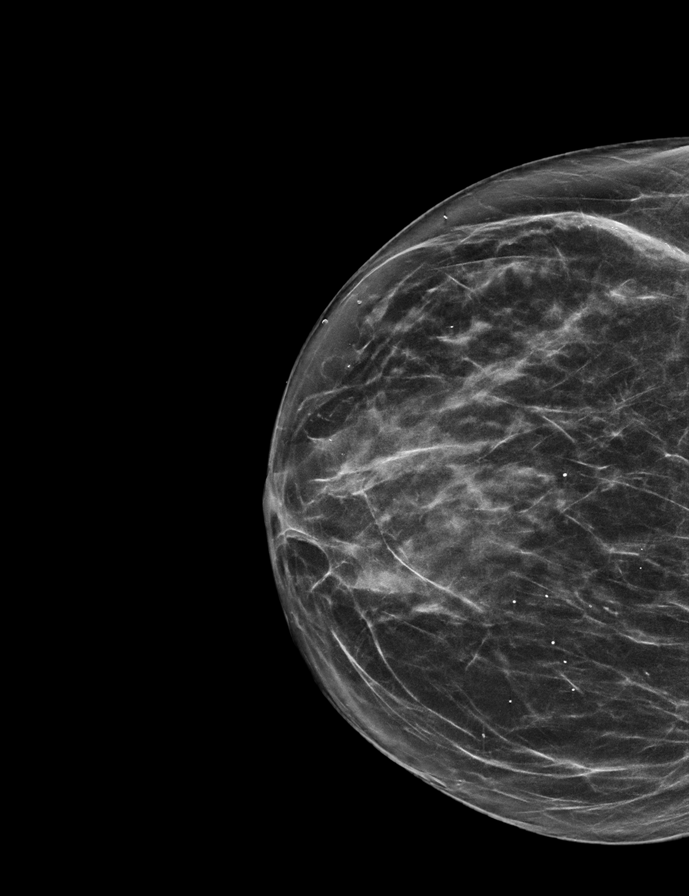

[R MLO synth-2D]
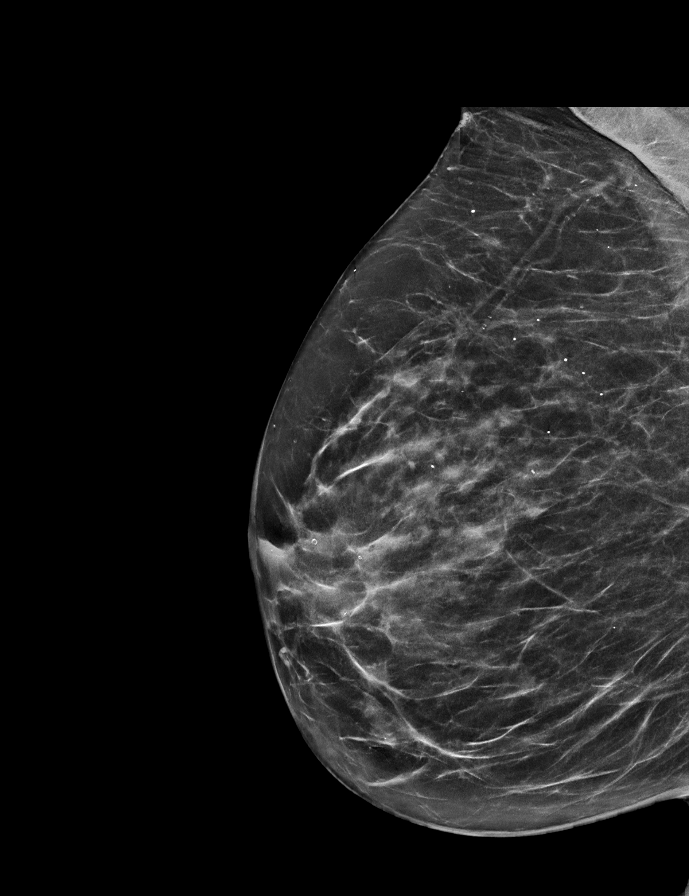

[L CC synth-2D]
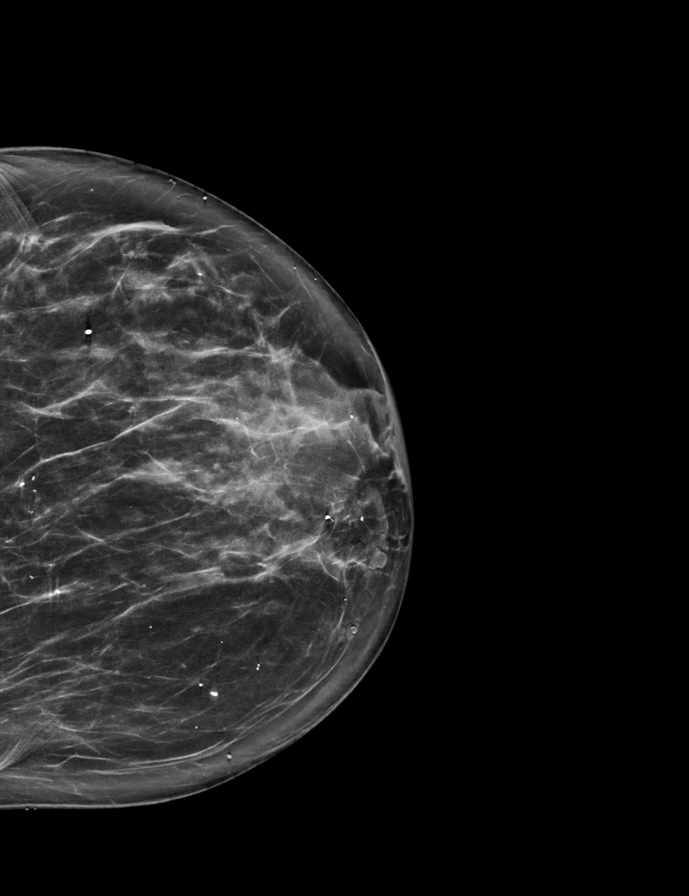

[L MLO synth-2D]
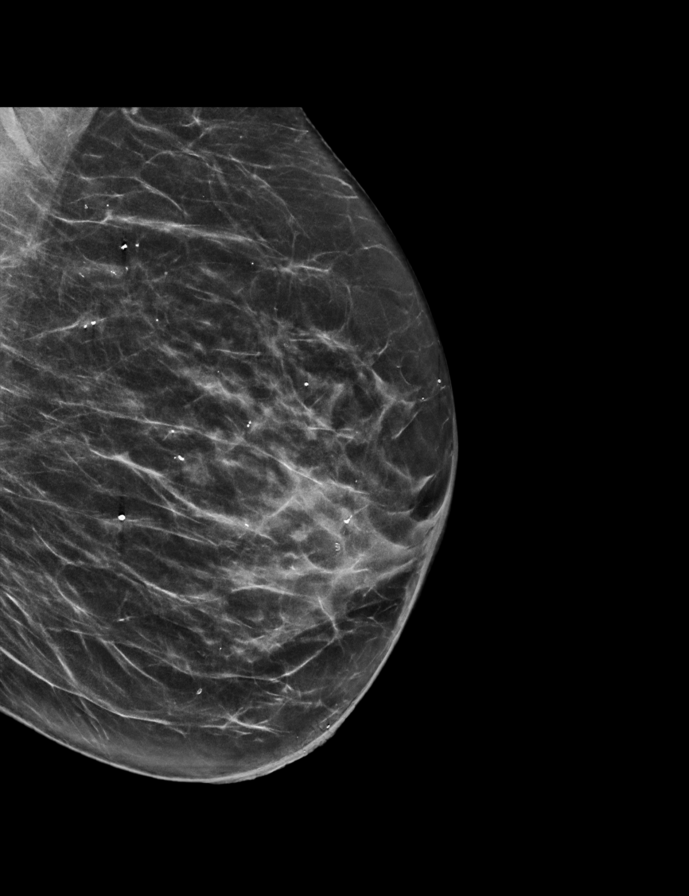

[R MLO tomo · 2 of 59 frames shown]
[frame 20/59]
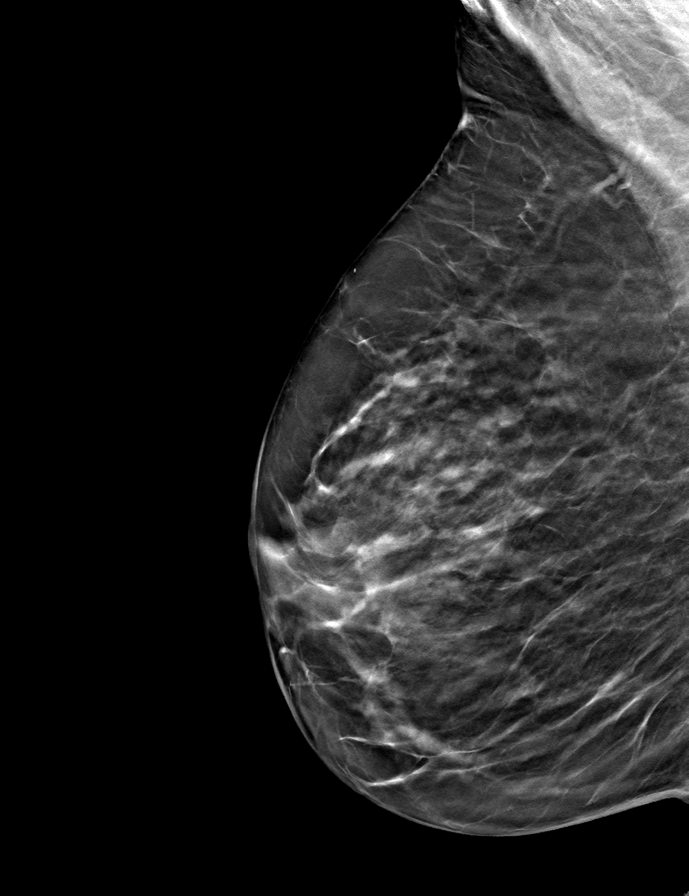
[frame 30/59]
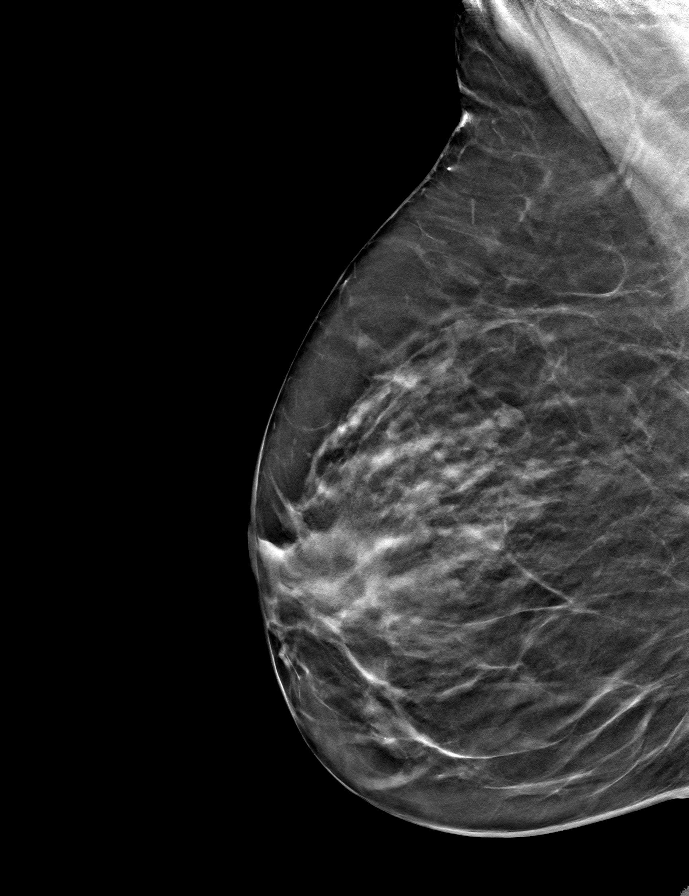

[L CC tomo · tomo slice 33/64.0]
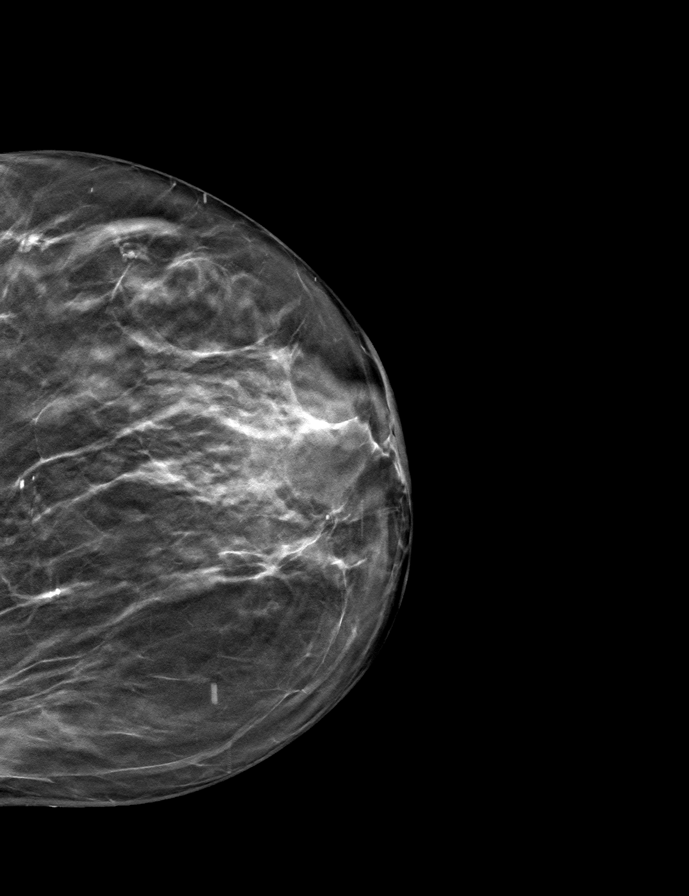

[R CC tomo · tomo slice 30/59.0]
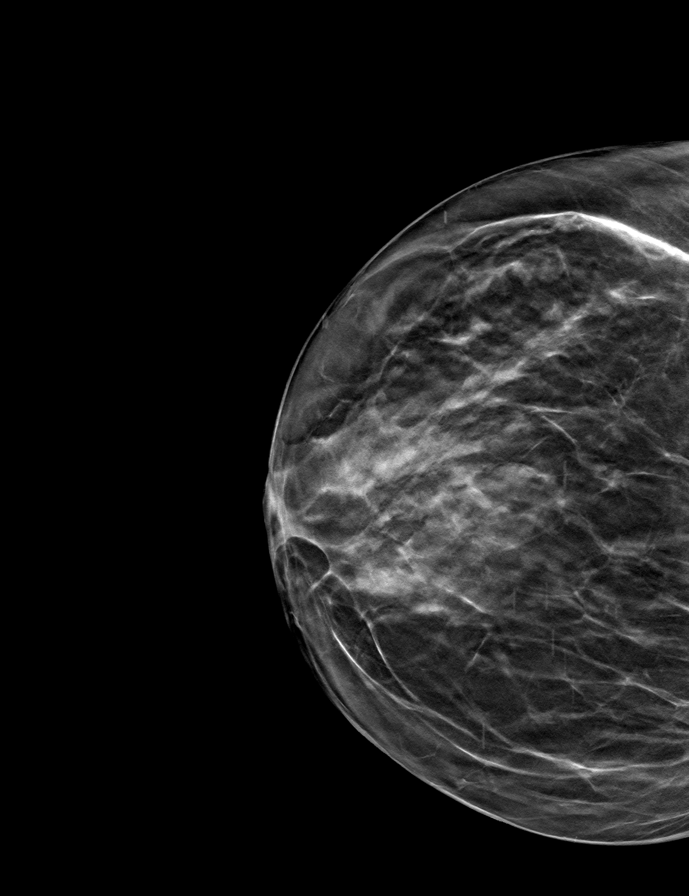

[L MLO tomo · tomo slice 31/60.0]
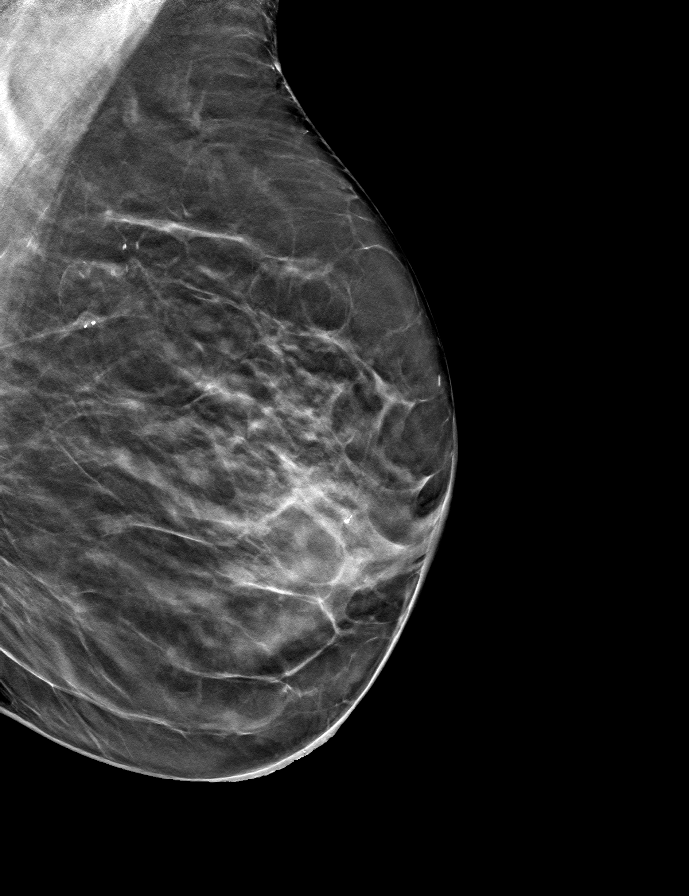

[9 of 24 positions shown; findings below may reference images not displayed]

ACR Breast Density Category c: The breast tissue is heterogeneously
dense, which may obscure small masses.
FINDINGS: There are no findings suspicious for malignancy.
IMPRESSION: No mammographic evidence of malignancy. A result letter of this
screening mammogram will be mailed directly to the patient.

RECOMMENDATION:
Screening mammogram in one year. (Code:Q3-W-BC3)

BI-RADS CATEGORY  1: Negative.

## 2023-12-30 ENCOUNTER — Other Ambulatory Visit: Payer: Self-pay | Admitting: Family Medicine

## 2023-12-30 DIAGNOSIS — Z1231 Encounter for screening mammogram for malignant neoplasm of breast: Secondary | ICD-10-CM

## 2024-01-26 ENCOUNTER — Ambulatory Visit
Admission: RE | Admit: 2024-01-26 | Discharge: 2024-01-26 | Disposition: A | Discharge status: Home / Self Care | Payer: Medicare Other | Source: Ambulatory Visit | Attending: Family Medicine | Admitting: Family Medicine

## 2024-01-26 DIAGNOSIS — Z1231 Encounter for screening mammogram for malignant neoplasm of breast: Secondary | ICD-10-CM
